# Patient Record
Sex: Female | Born: 2000 | Race: Black or African American | Hispanic: No | Marital: Single | Smoking: Never smoker
Health system: Southern US, Community
[De-identification: ages and names within clinical notes are randomized; demographics above are authoritative.]

## PROBLEM LIST (undated history)

## (undated) DIAGNOSIS — J45909 Unspecified asthma, uncomplicated: Secondary | ICD-10-CM

---
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
## 2018-02-04 DIAGNOSIS — Z00129 Encounter for routine child health examination without abnormal findings: Secondary | ICD-10-CM | POA: Diagnosis not present

## 2018-02-04 DIAGNOSIS — Z68.41 Body mass index (BMI) pediatric, greater than or equal to 95th percentile for age: Secondary | ICD-10-CM | POA: Diagnosis not present

## 2018-02-04 DIAGNOSIS — E663 Overweight: Secondary | ICD-10-CM | POA: Diagnosis not present

## 2018-04-14 DIAGNOSIS — R21 Rash and other nonspecific skin eruption: Secondary | ICD-10-CM | POA: Diagnosis not present

## 2018-04-14 DIAGNOSIS — R062 Wheezing: Secondary | ICD-10-CM | POA: Diagnosis not present

## 2018-04-14 DIAGNOSIS — J301 Allergic rhinitis due to pollen: Secondary | ICD-10-CM | POA: Diagnosis not present

## 2022-06-17 ENCOUNTER — Other Ambulatory Visit: Payer: Self-pay

## 2022-06-17 ENCOUNTER — Encounter (HOSPITAL_BASED_OUTPATIENT_CLINIC_OR_DEPARTMENT_OTHER): Payer: Self-pay | Admitting: Pediatrics

## 2022-06-17 ENCOUNTER — Emergency Department (HOSPITAL_BASED_OUTPATIENT_CLINIC_OR_DEPARTMENT_OTHER)
Admission: EM | Admit: 2022-06-17 | Discharge: 2022-06-17 | Disposition: A | Discharge status: Home / Self Care | Payer: 59 | Attending: Emergency Medicine | Admitting: Emergency Medicine

## 2022-06-17 DIAGNOSIS — Y92410 Unspecified street and highway as the place of occurrence of the external cause: Secondary | ICD-10-CM | POA: Insufficient documentation

## 2022-06-17 DIAGNOSIS — M79652 Pain in left thigh: Secondary | ICD-10-CM | POA: Diagnosis present

## 2022-06-17 DIAGNOSIS — M549 Dorsalgia, unspecified: Secondary | ICD-10-CM | POA: Insufficient documentation

## 2022-06-17 HISTORY — DX: Unspecified asthma, uncomplicated: J45.909
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
## 2022-06-17 NOTE — Discharge Instructions (Signed)
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
